# Patient Record
Sex: Male | Born: 1990 | Hispanic: Yes | Marital: Married | Smoking: Never smoker
Health system: Southern US, Community
[De-identification: ages and names within clinical notes are randomized; demographics above are authoritative.]

## PROBLEM LIST (undated history)

## (undated) DIAGNOSIS — R197 Diarrhea, unspecified: Secondary | ICD-10-CM

## (undated) DIAGNOSIS — E88819 Insulin resistance, unspecified: Secondary | ICD-10-CM

## (undated) HISTORY — DX: Diarrhea, unspecified: R19.7

---

## 2013-08-15 DIAGNOSIS — E041 Nontoxic single thyroid nodule: Secondary | ICD-10-CM | POA: Insufficient documentation

## 2014-06-14 DIAGNOSIS — R197 Diarrhea, unspecified: Secondary | ICD-10-CM

## 2014-06-14 HISTORY — DX: Diarrhea, unspecified: R19.7

## 2018-08-25 ENCOUNTER — Encounter: Payer: Self-pay | Admitting: Internal Medicine

## 2018-08-25 ENCOUNTER — Other Ambulatory Visit: Payer: Self-pay

## 2018-08-25 ENCOUNTER — Ambulatory Visit (INDEPENDENT_AMBULATORY_CARE_PROVIDER_SITE_OTHER): Payer: PRIVATE HEALTH INSURANCE | Admitting: Internal Medicine

## 2018-08-25 VITALS — BP 106/64 | HR 78 | Temp 98.3°F | Ht 63.6 in | Wt 175.0 lb

## 2018-08-25 DIAGNOSIS — R21 Rash and other nonspecific skin eruption: Secondary | ICD-10-CM | POA: Diagnosis not present

## 2018-08-25 DIAGNOSIS — L819 Disorder of pigmentation, unspecified: Secondary | ICD-10-CM | POA: Diagnosis not present

## 2018-08-25 NOTE — Patient Instructions (Signed)
Compre  [employer] y pongase a la piel por 10 minutos cada dia for 4-8 semanas.   Continua sin usar nada con Advice worker. y chocolate.  Pitiriasis versicolor Tinea Versicolor  La pitiriasis versicolor es una infeccin en la piel. Es causada por un tipo de levadura. La presencia de algunas levaduras es normal en la piel, pero el exceso de levaduras causa esta infeccin. La infeccin produce una erupcin de manchas claras u oscuras en la piel. La erupcin es ms frecuente que aparezca en la piel del pecho, la espalda, el cuello o la parte superior de [place]. La infeccin generalmente no causa otros problemas. Si se trata, la infeccin probablemente desaparezca en algunas semanas. La infeccin no se puede transmitir de [place] persona a otra (no es contagiosa). Siga estas indicaciones en su casa:  Use los medicamentos de venta libre y los recetados solamente como se lo haya indicado el mdico.  Frtese la piel todos los das con champ anticaspa como se lo haya indicado el mdico.  No se rasque la piel en la zona de la erupcin.  Evite los lugares clidos y hmedos.  No use cabinas de bronceado.  Trate de evitar transpirar demasiado. Comunquese con un mdico si:  Los sntomas empeoran.  Tiene fiebre.  Presenta enrojecimiento, hinchazn o dolor en la zona de la erupcin cutnea.  Observa lquido o sangre que sale de la erupcin cutnea.  La erupcin cutnea se siente caliente al tacto.  Tiene pus o percibe mal olor que [address] de la erupcin cutnea.  La erupcin cutnea regresa (es recurrente) despus del tratamiento. Resumen  La pitiriasis versicolor es una infeccin en la piel. Causa una erupcin de manchas claras u oscuras en la piel.  La erupcin es ms frecuente que aparezca en la piel del pecho, la espalda, el cuello o la parte superior de [place]. Esta infeccin generalmente no causa otros problemas.  Use los medicamentos de venta libre y los recetados solamente  como se lo haya indicado el mdico.  Si la infeccin se trata, probablemente desaparezca en algunas semanas. Esta informacin no tiene Theme park manager el consejo del mdico. Asegrese de hacerle al mdico cualquier pregunta que tenga. Document Released: 07/03/2010 Document Revised: 05/18/2017 Document Reviewed: 03/12/2014 Elsevier Interactive Patient Education  2019 [employer].

## 2018-08-25 NOTE — Progress Notes (Signed)
Subjective:     Patient ID: Vincent Curtis , male    DOB: 03-01-1991 , 28 y.o.   MRN: [id number]   Chief Complaint  Patient presents with  . New Patient (Initial Visit)    HPI  Pt is here to establish care. He would like me to look at a rash he has been dealing for a while. Has been having  skin discoloration and scaling x 2 months.  Has been having a rash x 3 years and hydrocortisone has helped the itching, but did not make it resolve the dark pigments. He has also tried antihistamines, and avoided chocolate and dairy and was asked to add hydrocortisone to a moisturizing cream, and the itching resolved while on it for 2 weeks, but the pigmented colors did not.  Has not been exposed to the sun, to tell if it does not tan or not.   Past Medical History:  Diagnosis Date  . Frequent loose stools 2016   even after avoiding dairy     Family History  Problem Relation Age of Onset  . Hypertension Mother   . Hypertension Maternal Grandmother     No current outpatient medications on file.      Review of Systems  Constitutional: Negative for chills, diaphoresis, fatigue and fever.  HENT: Negative for postnasal drip and rhinorrhea.   Gastrointestinal: Negative for abdominal distention, abdominal pain, constipation, diarrhea, nausea and vomiting.  Endocrine: Negative for polydipsia, polyphagia and polyuria.  Musculoskeletal: Negative for joint swelling.  Allergic/Immunologic: Positive for food allergies. Negative for environmental allergies.  Hematological: Negative for adenopathy.     Today's Vitals   08/25/18 0957  BP: 106/64  Pulse: 78  Temp: 98.3 F (36.8 C)  TempSrc: Oral  SpO2: 98%  Weight: 175 lb (79.4 kg)  Height: 5' 3.6" (1.615 m)   Body mass index is 30.42 kg/m.   Objective:  Physical Exam Vitals signs and nursing note reviewed.  Constitutional:      General: He is not in acute distress.    Appearance: He is obese. He is not toxic-appearing.  HENT:   Right Ear: External ear normal.     Left Ear: External ear normal.     Nose: Nose normal.     Mouth/Throat:     Mouth: Mucous membranes are moist.  Eyes:     General: No scleral icterus.    Conjunctiva/sclera: Conjunctivae normal.  Neck:     Musculoskeletal: Neck supple. No neck rigidity.  Pulmonary:     Effort: Pulmonary effort is normal.  Lymphadenopathy:     Cervical: No cervical adenopathy.  Skin:    General: Skin is warm and dry.     Findings: Rash present.     Comments: Has dark pigmented skin on his neck area, and dry light brown scales on abdomen and lower chest. Much milder on her back.   Neurological:     Mental Status: He is alert and oriented to person, place, and time.     Gait: Gait normal.  Psychiatric:        Mood and Affect: Mood normal.        Behavior: Behavior normal.        Thought Content: Thought content normal.        Judgment: Judgment normal.     Assessment And Plan:     1. Rash- chronic, seems food related, but may also has Tinea versicolor.  - CMP14 + Anion Gap   Advised to go back to  the diet restrictions and topical treament he tried in the past where he no longer had itching, plus I want him to try Selsun Blue shampoo on thorax area and leave it on during showers for 10 minutes qd. Fu 1 month.  2. Pigmented skin lesions- r/o insulin resistance.  - CMP14 + Anion Gap - Hemoglobin A1c - Insulin, random(561)   Rishit Burkhalter RODRIGUEZ-SOUTHWORTH, PA-C

## 2018-08-26 LAB — CMP14 + ANION GAP
ALT: 34 IU/L (ref 0–44)
AST: 25 IU/L (ref 0–40)
Albumin/Globulin Ratio: 1.6 (ref 1.2–2.2)
Albumin: 4.4 g/dL (ref 4.1–5.2)
Alkaline Phosphatase: 114 IU/L (ref 39–117)
Anion Gap: 13 mmol/L (ref 10.0–18.0)
BUN/Creatinine Ratio: 14 (ref 9–20)
BUN: 12 mg/dL (ref 6–20)
Bilirubin Total: 0.3 mg/dL (ref 0.0–1.2)
CO2: 25 mmol/L (ref 20–29)
CREATININE: 0.87 mg/dL (ref 0.76–1.27)
Calcium: 9.6 mg/dL (ref 8.7–10.2)
Chloride: 102 mmol/L (ref 96–106)
GFR calc Af Amer: 138 mL/min/{1.73_m2} (ref >59)
GFR, EST NON AFRICAN AMERICAN: 119 mL/min/{1.73_m2} (ref >59)
Globulin, Total: 2.8 g/dL (ref 1.5–4.5)
Glucose: 100 mg/dL — ABNORMAL HIGH (ref 65–99)
Potassium: 4.4 mmol/L (ref 3.5–5.2)
Sodium: 140 mmol/L (ref 134–144)
Total Protein: 7.2 g/dL (ref 6.0–8.5)

## 2018-08-26 LAB — HEMOGLOBIN A1C
Est. average glucose Bld gHb Est-mCnc: 97 mg/dL
Hgb A1c MFr Bld: 5 % (ref 4.8–5.6)

## 2018-08-26 LAB — INSULIN, RANDOM: INSULIN: 41.3 u[IU]/mL — ABNORMAL HIGH (ref 2.6–24.9)

## 2018-08-30 DIAGNOSIS — E8881 Metabolic syndrome: Secondary | ICD-10-CM | POA: Insufficient documentation

## 2018-09-26 ENCOUNTER — Ambulatory Visit: Payer: PRIVATE HEALTH INSURANCE | Admitting: Internal Medicine

## 2018-10-08 ENCOUNTER — Telehealth: Payer: Self-pay | Admitting: Internal Medicine

## 2018-10-08 NOTE — Telephone Encounter (Signed)
I spoke with pt to check on him regarding his diet and rash. His rash is gone, and he has cut down on carbs and has lot 1 kg, and is still working on wt loss and change in diet. Otherwise he is fine. He updated him on his wife which I will update on her chart. I told him since I am not in the clinic this month, to save my phone number in case they need me. Unsure if they will have me return to the clinic next month, but they can always communicate with me via my cell.

## 2018-11-16 ENCOUNTER — Ambulatory Visit (INDEPENDENT_AMBULATORY_CARE_PROVIDER_SITE_OTHER): Payer: PRIVATE HEALTH INSURANCE | Admitting: Internal Medicine

## 2018-11-16 ENCOUNTER — Encounter: Payer: Self-pay | Admitting: Internal Medicine

## 2018-11-16 ENCOUNTER — Other Ambulatory Visit: Payer: Self-pay

## 2018-11-16 VITALS — BP 118/62 | HR 72 | Temp 98.8°F | Ht 62.8 in | Wt 168.0 lb

## 2018-11-16 DIAGNOSIS — R7309 Other abnormal glucose: Secondary | ICD-10-CM

## 2018-11-16 DIAGNOSIS — L210 Seborrhea capitis: Secondary | ICD-10-CM

## 2018-11-16 NOTE — Progress Notes (Signed)
  Subjective:     Patient ID: Vincent Curtis , male    DOB: 1990-06-25 , 28 y.o.   MRN: [id number]   Chief Complaint  Patient presents with  . Abnormal Lab    HPI  Pt is here for FU rash and insulin resistance. He states he has lost more wt that what he weighs right now, but has gained some of it back. He has noticed if he eats sweets gets diarrhea specially ice cream. Has changed his milk to lactose free and low sugar.   Past Medical History:  Diagnosis Date  . Frequent loose stools 2016   even after avoiding dairy     Family History  Problem Relation Age of Onset  . Hypertension Mother   . Hypertension Maternal Grandmother     No current outpatient medications on file.      Review of Systems  Dealing with drandruf and has not used anything for this. He washes his hair every 3 days. Otherwise the rest is neg.  Today's Vitals   11/16/18 1508  BP: 118/62  Pulse: 72  Temp: 98.8 F (37.1 C)  TempSrc: Oral  SpO2: 98%  Weight: 168 lb (76.2 kg)  Height: 5' 2.8" (1.595 m)   Body mass index is 29.95 kg/m.   Objective:  Physical Exam  7 lbs down.  Constitutional: he is oriented to person, place, and time. he appears well-developed and well-nourished. No distress.  HENT:  Head: Normocephalic and atraumatic.  Right Ear: External ear normal.  Left Ear: External ear normal.  Nose: Nose normal.  Eyes: Conjunctivae are normal. Right eye exhibits no discharge. Left eye exhibits no discharge. No scleral icterus.  Neck: Neck supple. No thyromegaly present.  Cardiovascular: Normal rate and regular rhythm.  No murmur heard. Pulmonary/Chest: Effort normal and breath sounds normal. No respiratory distress.  Musculoskeletal: Normal range of motion. She exhibits no edema.  Lymphadenopathy: he has no cervical adenopathy.  Neurological: he is alert and oriented to person, place, and time.  Skin: Skin is warm and dry. No rash noted, he is not diaphoretic.  Psychiatric: he has a  normal mood and affect. Hid behavior is normal. Judgment and thought content normal.  Nursing note reviewed.    Assessment And Plan:    1. Abnormal glucose  And insulin resistance. He will continue watching his sugar intake and loosing wt.  - Hemoglobin A1c - Insulin, random(561)  2. Dandruff in adult- chronic     I advised him to get T gel or Ketoconazole shampoo as use it as directed.   FU in 3 months for a physical.   Vincent Wotton RODRIGUEZ-SOUTHWORTH, PA-C    THE PATIENT IS ENCOURAGED TO PRACTICE SOCIAL DISTANCING DUE TO THE COVID-19 PANDEMIC.

## 2018-11-17 LAB — INSULIN, RANDOM: INSULIN: 117 u[IU]/mL — ABNORMAL HIGH (ref 2.6–24.9)

## 2018-11-17 LAB — HEMOGLOBIN A1C
Est. average glucose Bld gHb Est-mCnc: 103 mg/dL
Hgb A1c MFr Bld: 5.2 % (ref 4.8–5.6)

## 2018-11-23 ENCOUNTER — Encounter: Payer: Self-pay | Admitting: Internal Medicine

## 2018-11-23 DIAGNOSIS — E8881 Metabolic syndrome: Secondary | ICD-10-CM

## 2018-11-30 MED ORDER — METFORMIN HCL 500 MG PO TABS: ORAL_TABLET | ORAL | 0 refills | Status: DC

## 2019-02-15 ENCOUNTER — Other Ambulatory Visit: Payer: Self-pay

## 2019-02-15 ENCOUNTER — Encounter: Payer: Self-pay | Admitting: Internal Medicine

## 2019-02-15 ENCOUNTER — Ambulatory Visit (INDEPENDENT_AMBULATORY_CARE_PROVIDER_SITE_OTHER): Payer: PRIVATE HEALTH INSURANCE | Admitting: Internal Medicine

## 2019-02-15 VITALS — BP 118/78 | HR 72 | Temp 98.4°F | Ht 62.8 in | Wt 162.6 lb

## 2019-02-15 DIAGNOSIS — Z0000 Encounter for general adult medical examination without abnormal findings: Secondary | ICD-10-CM | POA: Diagnosis not present

## 2019-02-15 DIAGNOSIS — E8881 Metabolic syndrome: Secondary | ICD-10-CM

## 2019-02-15 DIAGNOSIS — Z23 Encounter for immunization: Secondary | ICD-10-CM | POA: Diagnosis not present

## 2019-02-15 LAB — POCT URINALYSIS DIPSTICK
Bilirubin, UA: NEGATIVE
Glucose, UA: NEGATIVE
Ketones, UA: NEGATIVE
Leukocytes, UA: NEGATIVE
Nitrite, UA: NEGATIVE
Protein, UA: NEGATIVE
Spec Grav, UA: >=1.03 — AB (ref 1.010–1.025)
Urobilinogen, UA: 0.2 E.U./dL
pH, UA: 5.5 (ref 5.0–8.0)

## 2019-02-15 LAB — POCT UA - MICROALBUMIN
Albumin/Creatinine Ratio, Urine, POC: <30
Creatinine, POC: 200 mg/dL
Microalbumin Ur, POC: 10 mg/L

## 2019-02-15 MED ORDER — METFORMIN HCL 500 MG PO TABS: ORAL_TABLET | ORAL | 0 refills | Status: DC

## 2019-02-15 NOTE — Progress Notes (Signed)
Subjective:     Patient ID: Vincent Curtis , male    DOB: November 15, 1990 , 28 y.o.   MRN: [id number]   Chief Complaint  Patient presents with  . Annual Exam    HPI  Pt is here for a physical and FU on insulin resistance. Does not have any complaints   Past Medical History:  Diagnosis Date  . Frequent loose stools 2016   even after avoiding dairy     Family History  Problem Relation Age of Onset  . Hypertension Mother   . Hypertension Maternal Grandmother      Current Outpatient Medications:  .  metFORMIN (GLUCOPHAGE) 500 MG tablet, One with dinner for insulin resistance, Disp: 90 tablet, Rfl: 0      Review of Systems  His loose stools have resolved, + for wt gain, the rest is neg.  Today's Vitals   02/15/19 1423  BP: 118/78  Pulse: 72  Temp: 98.4 F (36.9 C)  TempSrc: Oral  Weight: 162 lb 9.6 oz (73.8 kg)  Height: 5' 2.8" (1.595 m)   Body mass index is 28.99 kg/m.   Objective:  Physical Exam  Wt is down 6 lbs since on metformin.   BP 118/78 (BP Location: Left Arm, Patient Position: Sitting, Cuff Size: Normal)   Pulse 72   Temp 98.4 F (36.9 C) (Oral)   Ht 5' 2.8" (1.595 m)   Wt 162 lb 9.6 oz (73.8 kg)   BMI 28.99 kg/m   General Appearance:    Alert, cooperative, no distress, appears stated age  Head:    Normocephalic, without obvious abnormality, atraumatic  Eyes:    PERRL, conjunctiva/corneas clear, EOM's intact, fundi    benign, both eyes       Ears:    Normal TM's and external ear canals, both ears  Nose:   Nares normal, septum midline, mucosa normal, no drainage   or sinus tenderness  Throat:   Lips, mucosa, and tongue normal; teeth and gums normal  Neck:   Supple, symmetrical, trachea midline, no adenopathy;       thyroid:  No enlargement/tenderness/nodules.  Back:     Symmetric, no curvature, ROM normal, no CVA tenderness  Lungs:     Clear to auscultation bilaterally, respirations unlabored  Chest wall:    No tenderness or deformity  Heart:     Regular rate and rhythm, S1 and S2 normal, no murmur, rub   or gallop  Abdomen:     Soft, non-tender, bowel sounds active all four quadrants,    no masses, no organomegaly  Genitalia:    Normal male without lesion, discharge or tenderness     Extremities:   Extremities normal, atraumatic, no cyanosis or edema  Pulses:   2+ and symmetric all extremities  Skin:   Skin color, texture, turgor normal, no rashes or lesions  Lymph nodes:   Cervical, supraclavicular, and axillary nodes normal  Neurologic:   CNII-XII intact. Normal strength, sensation and reflexes      throughout    Assessment And Plan:     1. Need for influenza vaccination - Flu Vaccine QUAD 6+ mos PF IM (Fluarix Quad PF)  2. Routine medical exam- routine. Encouraged to get back on low carb diet.    I will inform him when his labs are back.  - CMP14 + Anion Gap - Lipid Profile - CBC no Diff - POCT Urinalysis Dipstick (81002)- neg 3- Insulin resistance- chronic. Insulin levels ordered. May continue Metformin 500 mg  qd.       Crissie Aloi RODRIGUEZ-SOUTHWORTH, PA-C    THE PATIENT IS ENCOURAGED TO PRACTICE SOCIAL DISTANCING DUE TO THE COVID-19 PANDEMIC.

## 2019-02-15 NOTE — Patient Instructions (Signed)
Cuidados preventivos en los hombres de 21 a 39 aos de edad Preventive Care 39-27 Years Old, Male Los cuidados preventivos hacen referencia a las opciones en cuanto al estilo de vida y a las visitas al mdico, las cuales pueden promover la salud y [name]. Esto puede comprender lo siguiente:  Un examen fsico anual. Esto tambin se conoce como control de bienestar anual.  Exmenes dentales y oculares de [place] regular.  Vacunas.  Estudios para Hospital doctor.  Opciones saludables de estilo de vida, como seguir una dieta saludable, hacer ejercicio regularmente, no usar drogas ni productos que contengan nicotina y tabaco, y limitar el consumo de alcohol. Qu puedo esperar para mi visita de cuidado preventivo? Examen fsico El mdico controlar lo siguiente:  Diplomatic Services operational officer y [place]. Estos datos se pueden usar para calcular el ndice de masa corporal (IMC), una medicin que indica si usted tiene un peso saludable.  Frecuencia cardaca y presin arterial.  Piel para detectar manchas anormales. Asesoramiento El mdico puede hacerle preguntas sobre lo siguiente:  Consumo de tabaco, alcohol y drogas.  Bienestar emocional.  Bienestar en el hogar y sus relaciones personales.  Actividad sexual.  Hbitos de alimentacin.  Trabajo y [place] laboral. [name] vacunas necesito?  [place] antigripal  Se recomienda aplicarse esta vacuna todos los [place]. Vacuna contra el ttanos, la difteria y la tos ferina (Tdap)  Es posible que tenga que aplicarse un refuerzo contra el ttanos y la difteria (DT) cada 10aos. Vacuna contra la varicela  Es posible que tenga que aplicrsela si an no la recibi. Vacuna contra el virus del papiloma humano (VPH)  Si el mdico se lo recomienda, Engineer, materials tres dosis a lo largo de 6 meses. Vacuna contra el sarampin, la rubola y las paperas (SRP)  Tal vez tenga que aplicarse por lo menos una dosis de la [place]. Tambin es posible que necesite  una segunda dosis. Vacuna antimeningoccica conjugada (MenACWY)  Se recomienda la aplicacin de una dosis si tiene [employer] 19 y los 21aos de [place], y es estudiante universitario de Engineer, maintenance ao que vive en una residencia estudiantil, o si tiene una de varias afecciones mdicas. Podra tambin necesitar dosis de refuerzo. Vacuna antineumoccica conjugada (PCV13)  Puede necesitar esta vacuna si tiene determinadas enfermedades y no se vacun anteriormente. [name] antineumoccica de polisacridos (PPSV23)  Quizs tenga que aplicarse una o dos dosis si fuma o si tiene determinadas afecciones. Vacuna contra la hepatitis A  Es posible que necesite esta vacuna si tiene ciertas afecciones o si viaja o trabaja en lugares en los que podra estar expuesto a la hepatitis A. Vacuna contra la hepatitis B  Es posible que necesite esta vacuna si tiene ciertas afecciones o si viaja o trabaja en lugares en los que podra estar expuesto a la hepatitis B. Vacuna antihaemophilus influenzae tipo B (Hib)  Es posible que necesite esta vacuna si tiene algunos factores de [place]. Puede recibir las vacunas en forma de dosis individuales o en forma de dos o ms vacunas juntas en la misma inyeccin (vacunas combinadas). Hable con su mdico [employer] y beneficios de las vacunas [place]. Qu pruebas necesito? Anlisis de [employer] de lpidos y colesterol. Estos se pueden verificar cada 5 aos, a partir de los [address] de [place].  Anlisis de hepatitisC.  Anlisis de hepatitisB. Pruebas de deteccin   Pruebas de deteccin de la diabetes. Esto se realiza mediante un control del azcar en la sangre (glucosa) despus de no haber comido durante  un periodo de tiempo (ayuno).  Anlisis de enfermedades de transmisin sexual (ETS). Hable con su mdico [employer], las opciones de tratamiento y, si corresponde, la necesidad de Optometrist ms pruebas. Siga estas instrucciones en su  casa: Comida y bebida   Siga una dieta que incluya frutas y verduras frescas, cereales integrales, protenas magras y productos lcteos descremados.  Tome los suplementos vitamnicos y [employer] se lo haya indicado el mdico.  No beba alcohol si el mdico se lo prohbe.  Si bebe alcohol: ? Limite la cantidad que consume de 0 a 2 medidas por da. ? Est atento a la cantidad de alcohol que hay en las bebidas que toma. En los [place], una medida equivale a una botella de cerveza de 12oz (335ml), un vaso de vino de 5oz (179ml) o un vaso de una bebida alcohlica de alta graduacin de 1oz (27ml). Estilo de [employer] y las encas a diario.  Mantngase activo. Haga al menos 66minutos de ejercicio 5o ms [employer].  No consuma ningn producto que contenga nicotina o tabaco, como cigarrillos, cigarrillos electrnicos y tabaco de Higher education careers adviser. Si necesita ayuda para dejar de fumar, consulte al mdico.  Si es sexualmente activo, practique sexo seguro. Use un condn u otra forma de proteccin para prevenir las ITS (infecciones de transmisin sexual). Cundo volver?  Acuda al mdico una vez al ao para una visita de control.  Pregntele al mdico con qu frecuencia debe realizarse un control de la vista y los dientes.  Mantenga su esquema de vacunacin al da. Esta informacin no tiene Marine scientist el consejo del mdico. Asegrese de hacerle al mdico cualquier pregunta que tenga. Document Released: 03/10/2005 Document Revised: 06/23/2018 Document Reviewed: 06/23/2018 Elsevier Patient Education  2020 [employer].

## 2019-02-20 ENCOUNTER — Other Ambulatory Visit: Payer: PRIVATE HEALTH INSURANCE

## 2019-03-08 DIAGNOSIS — E782 Mixed hyperlipidemia: Secondary | ICD-10-CM | POA: Insufficient documentation

## 2019-03-13 ENCOUNTER — Other Ambulatory Visit: Payer: Self-pay

## 2019-03-13 ENCOUNTER — Other Ambulatory Visit: Payer: PRIVATE HEALTH INSURANCE

## 2019-03-13 DIAGNOSIS — Z0000 Encounter for general adult medical examination without abnormal findings: Secondary | ICD-10-CM

## 2019-03-14 LAB — LIPID PANEL
Chol/HDL Ratio: 4.7 ratio (ref 0.0–5.0)
Cholesterol, Total: 207 mg/dL — ABNORMAL HIGH (ref 100–199)
HDL: 44 mg/dL (ref >39)
LDL Chol Calc (NIH): 134 mg/dL — ABNORMAL HIGH (ref 0–99)
Triglycerides: 159 mg/dL — ABNORMAL HIGH (ref 0–149)
VLDL Cholesterol Cal: 29 mg/dL (ref 5–40)

## 2019-03-14 LAB — CMP14 + ANION GAP
ALT: 19 IU/L (ref 0–44)
AST: 21 IU/L (ref 0–40)
Albumin/Globulin Ratio: 1.6 (ref 1.2–2.2)
Albumin: 4.8 g/dL (ref 4.1–5.2)
Alkaline Phosphatase: 107 IU/L (ref 39–117)
Anion Gap: 14 mmol/L (ref 10.0–18.0)
BUN/Creatinine Ratio: 18 (ref 9–20)
BUN: 17 mg/dL (ref 6–20)
Bilirubin Total: 0.6 mg/dL (ref 0.0–1.2)
CO2: 25 mmol/L (ref 20–29)
Calcium: 9.6 mg/dL (ref 8.7–10.2)
Chloride: 100 mmol/L (ref 96–106)
Creatinine, Ser: 0.95 mg/dL (ref 0.76–1.27)
GFR calc Af Amer: 126 mL/min/{1.73_m2} (ref >59)
GFR calc non Af Amer: 109 mL/min/{1.73_m2} (ref >59)
Globulin, Total: 3 g/dL (ref 1.5–4.5)
Glucose: 86 mg/dL (ref 65–99)
Potassium: 4.9 mmol/L (ref 3.5–5.2)
Sodium: 139 mmol/L (ref 134–144)
Total Protein: 7.8 g/dL (ref 6.0–8.5)

## 2019-03-14 LAB — CBC
Hematocrit: 48.4 % (ref 37.5–51.0)
Hemoglobin: 16.3 g/dL (ref 13.0–17.7)
MCH: 29.1 pg (ref 26.6–33.0)
MCHC: 33.7 g/dL (ref 31.5–35.7)
MCV: 86 fL (ref 79–97)
Platelets: 280 10*3/uL (ref 150–450)
RBC: 5.61 x10E6/uL (ref 4.14–5.80)
RDW: 12.8 % (ref 11.6–15.4)
WBC: 8.2 10*3/uL (ref 3.4–10.8)

## 2019-03-15 ENCOUNTER — Encounter: Payer: Self-pay | Admitting: Internal Medicine

## 2019-03-20 ENCOUNTER — Encounter: Payer: Self-pay | Admitting: Internal Medicine

## 2019-05-23 ENCOUNTER — Other Ambulatory Visit: Payer: Self-pay

## 2019-05-23 ENCOUNTER — Ambulatory Visit
Admission: EM | Admit: 2019-05-23 | Discharge: 2019-05-23 | Disposition: A | Discharge status: Home / Self Care | Payer: PRIVATE HEALTH INSURANCE | Attending: Urgent Care | Admitting: Urgent Care

## 2019-05-23 DIAGNOSIS — Z20828 Contact with and (suspected) exposure to other viral communicable diseases: Secondary | ICD-10-CM | POA: Diagnosis not present

## 2019-05-23 DIAGNOSIS — Z20822 Contact with and (suspected) exposure to covid-19: Secondary | ICD-10-CM

## 2019-05-23 NOTE — ED Triage Notes (Signed)
Pt presents to UC w/ c/o covid positive exposure 2 days ago. Pt denies any symptoms at this time.

## 2019-05-23 NOTE — ED Provider Notes (Signed)
[facility]    MRN: [id number] DOB: 07/04/1990  Subjective:   Vincent Curtis is a 28 y.o. male presenting for COVID-19 testing.  He had exposure about 3 days ago.  Denies having any symptoms  No current facility-administered medications for this encounter.   Current Outpatient Medications:  .  metFORMIN (GLUCOPHAGE) 500 MG tablet, One with dinner for insulin resistance, Disp: 90 tablet, Rfl: 0   No Known Allergies  Past Medical History:  Diagnosis Date  . Frequent loose stools 2016   even after avoiding dairy     History reviewed. No pertinent surgical history.  Family History  Problem Relation Age of Onset  . Hypertension Mother   . Hypertension Maternal Grandmother     Social History   Tobacco Use  . Smoking status: Never Smoker  . Smokeless tobacco: Never Used  Substance Use Topics  . Alcohol use: Not Currently  . Drug use: Never    Review of Systems  Constitutional: Negative for fever and malaise/fatigue.  HENT: Negative for congestion, ear pain, sinus pain and sore throat.   Eyes: Negative for discharge and redness.  Respiratory: Negative for cough, hemoptysis, shortness of breath and wheezing.   Cardiovascular: Negative for chest pain.  Gastrointestinal: Negative for abdominal pain, diarrhea, nausea and vomiting.  Genitourinary: Negative for dysuria, flank pain and hematuria.  Musculoskeletal: Negative for myalgias.  Skin: Negative for rash.  Neurological: Negative for dizziness, weakness and headaches.  Psychiatric/Behavioral: Negative for depression and substance abuse.     Objective:   Vitals: BP 116/76 (BP Location: Right Arm)   Pulse 79   Temp 98.8 F (37.1 C) (Oral)   Resp 16   SpO2 97%   Physical Exam Constitutional:      General: He is not in acute distress.    Appearance: Normal appearance. He is well-developed and normal weight. He is not ill-appearing, toxic-appearing or diaphoretic.  HENT:     Head:  Normocephalic and atraumatic.     Right Ear: External ear normal.     Left Ear: External ear normal.     Nose: Nose normal.     Mouth/Throat:     Mouth: Mucous membranes are moist.     Pharynx: Oropharynx is clear.  Eyes:     General: No scleral icterus.       Right eye: No discharge.        Left eye: No discharge.     Extraocular Movements: Extraocular movements intact.     Pupils: Pupils are equal, round, and reactive to light.  Neck:     Musculoskeletal: Normal range of motion.  Cardiovascular:     Rate and Rhythm: Normal rate and regular rhythm.     Heart sounds: Normal heart sounds. No murmur. No friction rub. No gallop.   Pulmonary:     Effort: Pulmonary effort is normal. No respiratory distress.     Breath sounds: Normal breath sounds. No stridor. No wheezing, rhonchi or rales.  Neurological:     Mental Status: He is alert and oriented to person, place, and time.  Psychiatric:        Mood and Affect: Mood normal.        Behavior: Behavior normal.        Thought Content: Thought content normal.        Judgment: Judgment normal.      Assessment and Plan :   1. Close exposure to COVID-19 virus    Counseled patient on nature of COVID-19  including modes of transmission, diagnostic testing, management and supportive care.  Counseled on medications used for symptomatic relief. COVID 19 testing is pending. Counseled patient on potential for adverse effects with medications prescribed/recommended today, ER and return-to-clinic precautions discussed, patient verbalized understanding.      Wallis Bamberg, PA-C 05/23/19 1624

## 2019-05-23 NOTE — Discharge Instructions (Signed)
Para el dolor de garganta o tos intente usar un t a base de miel. Use 3 cucharaditas de miel con jugo exprimido de [employer]. Coloque trozos de Pension scheme manager en 1 / 2-1 taza de agua y caliente sobre la estufa. Luego mezcle los ingredientes y repita cada 4 horas segn sea necesario. Tome Tylenol 500 mg cada 6 horas. Hidrata muy bien con al menos 2 litros de [place]. Coma comidas ligeras como sopas para [employer] electrolitos y coma frutas suaves, verduras. Comience un antihistamnico como Zyrtec, Allegra o Claritin. Puede recoger pseudoefedrina de venta libre (Sudafed) y usarla para el drenaje posnasal, la congestin nasal a una dosis de 60 mg cada 8 horas o 120 mg cada 12 horas, segn sea necesario. Use el jarabe por la noche para su tos y las capsulas durante el dia.

## 2019-05-25 LAB — NOVEL CORONAVIRUS, NAA: SARS-CoV-2, NAA: NOT DETECTED

## 2019-07-02 ENCOUNTER — Other Ambulatory Visit: Payer: Self-pay | Admitting: Internal Medicine

## 2019-07-02 MED ORDER — METFORMIN HCL 500 MG PO TABS: ORAL_TABLET | ORAL | 0 refills | Status: DC

## 2019-08-16 ENCOUNTER — Other Ambulatory Visit: Payer: Self-pay

## 2019-08-16 ENCOUNTER — Ambulatory Visit (INDEPENDENT_AMBULATORY_CARE_PROVIDER_SITE_OTHER): Payer: PRIVATE HEALTH INSURANCE | Admitting: Internal Medicine

## 2019-08-16 ENCOUNTER — Encounter: Payer: Self-pay | Admitting: Internal Medicine

## 2019-08-16 VITALS — BP 118/82 | HR 62 | Temp 98.5°F | Ht 62.0 in | Wt 175.8 lb

## 2019-08-16 DIAGNOSIS — E669 Obesity, unspecified: Secondary | ICD-10-CM

## 2019-08-16 DIAGNOSIS — R7309 Other abnormal glucose: Secondary | ICD-10-CM

## 2019-08-16 DIAGNOSIS — E8881 Metabolic syndrome: Secondary | ICD-10-CM

## 2019-08-16 DIAGNOSIS — R635 Abnormal weight gain: Secondary | ICD-10-CM | POA: Diagnosis not present

## 2019-08-16 DIAGNOSIS — E7849 Other hyperlipidemia: Secondary | ICD-10-CM

## 2019-08-16 DIAGNOSIS — Z6832 Body mass index (BMI) 32.0-32.9, adult: Secondary | ICD-10-CM

## 2019-08-16 DIAGNOSIS — E041 Nontoxic single thyroid nodule: Secondary | ICD-10-CM

## 2019-08-16 NOTE — Progress Notes (Addendum)
This visit occurred during the SARS-CoV-2 public health emergency.  Safety protocols were in place, including screening questions prior to the visit, additional usage of staff PPE, and extensive cleaning of exam room while observing appropriate contact time as indicated for disinfecting solutions.  Subjective:     Patient ID: Vincent Curtis , male    DOB: February 24, 1991 , 29 y.o.   MRN: [id number]   Chief Complaint  Patient presents with  . Diabetes    HPI Here for FU wt and Insuline resistance. Admits he has not been strict with his diet at all. Has been taking the Metformin qd and is tolerating this fine.     Past Medical History:  Diagnosis Date  . Frequent loose stools 2016   even after avoiding dairy     Family History  Problem Relation Age of Onset  . Hypertension Mother   . Hypertension Maternal Grandmother   Negative for thyroid cancer   Current Outpatient Medications:  .  metFORMIN (GLUCOPHAGE) 500 MG tablet, One with dinner for insulin resistance, Disp: 90 tablet, Rfl: 0   No Known Allergies   Review of Systems  Denies polydipsia, polyuria, polyphagia, heat or cold intolerance. + for weight gain Has hx of R thydoid lump 6 years ago and when he lived in [place] country had what sound like a scan and was told every thing was fine and was never told to have any follow up testing since, but on occasion feels pulsation on his R thyroid lobe area where he feels a small knot. This area is not tender. Paternal aunt has thyroid disease Today's Vitals   08/16/19 0840  BP: 118/82  Pulse: 62  Temp: 98.5 F (36.9 C)  Weight: 175 lb 12.8 oz (79.7 kg)  Height: 5\' 2"  (1.575 m)   Body mass index is 32.15 kg/m.   Objective:  Physical Exam  Wt is up 13 lbs   Constitutional: he is oriented to person, place, and time. he appears well-developed and well-nourished. No distress.  HENT:  Head: Normocephalic and atraumatic.  Right Ear: External ear normal.  Left Ear: External ear  normal.  Nose: Nose normal.  Eyes: Conjunctivae are normal. Right eye exhibits no discharge. Left eye exhibits no discharge. No scleral icterus.  Neck: Neck supple. Has  A peas size nodule on R thyroid lobe but seems more towads the anterior region of the sternocleidomastoid medially. No carotid bruits bilaterally  Cardiovascular: Normal rate and regular rhythm.  No murmur heard. Pulmonary/Chest: Effort normal and breath sounds normal. No respiratory distress.  Musculoskeletal: Normal range of motion. he exhibits no edema.  Lymphadenopathy:  he has no cervical adenopathy. Neurological: he is alert and oriented to person, place, and time.  Skin: Skin is warm and dry. Capillary refill takes less than 2 seconds. He is not diaphoretic.  Psychiatric: he has a normal mood and affect. His behavior is normal. Judgment and thought content normal.  Nursing note reviewed.  Assessment And Plan:     1. Abnormal glucose- chronic - CMP14 + Anion Gap  2. Insulin resistance- chronic. With his weight increasing every time I see him, I am sure his levels are still elevated. I educated him about Ozempic and how it works bs Rybelsus the oral form, but he said he does not always remember to take the [place] and once a week shot would be easier for him and is willing to try the Lerna. I reviewed the common side effects like nausea if he pushes  eating when he starts feeling full and constipation. I started him on Ozempic 0.25 mg /week x 4 weeks to see if this will help him better with wt loss and insulin resistance. One sample box give. FU in 4 weeks.    He was educated about this medication and given precautions of this med.  - Insulin, random([phone])  3. Other hyperlipidemia- chronic. No action. Will await on his results.  - Lipid panel  4. Weight gain- persistent and worsening.  - CMP14 + Anion Gap  5. Thyroid nodule- chronic.  - TSH - Thyroid antibodies - Thyroid Peroxidase Antibody - US THYROID;  Future 6. Obesity- worsening. I am hoping the Ozempic will help him with weight loss.  Fu in 1 month for wt check and how he is doing with the Ozempic.  Vincent Leaming RODRIGUEZ-SOUTHWORTH, PA-C    THE PATIENT IS ENCOURAGED TO PRACTICE SOCIAL DISTANCING DUE TO THE COVID-19 PANDEMIC.

## 2019-08-17 ENCOUNTER — Other Ambulatory Visit: Payer: Self-pay

## 2019-08-17 ENCOUNTER — Ambulatory Visit
Admission: EM | Admit: 2019-08-17 | Discharge: 2019-08-17 | Disposition: A | Discharge status: Home / Self Care | Payer: 59 | Attending: Emergency Medicine | Admitting: Emergency Medicine

## 2019-08-17 DIAGNOSIS — R6889 Other general symptoms and signs: Secondary | ICD-10-CM

## 2019-08-17 DIAGNOSIS — Z20822 Contact with and (suspected) exposure to covid-19: Secondary | ICD-10-CM | POA: Diagnosis not present

## 2019-08-17 LAB — CMP14 + ANION GAP
ALT: 24 IU/L (ref 0–44)
AST: 18 IU/L (ref 0–40)
Albumin/Globulin Ratio: 1.5 (ref 1.2–2.2)
Albumin: 4.4 g/dL (ref 4.1–5.2)
Alkaline Phosphatase: 103 IU/L (ref 39–117)
Anion Gap: 15 mmol/L (ref 10.0–18.0)
BUN/Creatinine Ratio: 13 (ref 9–20)
BUN: 12 mg/dL (ref 6–20)
Bilirubin Total: 0.7 mg/dL (ref 0.0–1.2)
CO2: 24 mmol/L (ref 20–29)
Calcium: 9.4 mg/dL (ref 8.7–10.2)
Chloride: 100 mmol/L (ref 96–106)
Creatinine, Ser: 0.92 mg/dL (ref 0.76–1.27)
GFR calc Af Amer: 131 mL/min/{1.73_m2} (ref >59)
GFR calc non Af Amer: 114 mL/min/{1.73_m2} (ref >59)
Globulin, Total: 3 g/dL (ref 1.5–4.5)
Glucose: 84 mg/dL (ref 65–99)
Potassium: 4.7 mmol/L (ref 3.5–5.2)
Sodium: 139 mmol/L (ref 134–144)
Total Protein: 7.4 g/dL (ref 6.0–8.5)

## 2019-08-17 LAB — LIPID PANEL
Chol/HDL Ratio: 4.5 ratio (ref 0.0–5.0)
Cholesterol, Total: 211 mg/dL — ABNORMAL HIGH (ref 100–199)
HDL: 47 mg/dL (ref >39)
LDL Chol Calc (NIH): 135 mg/dL — ABNORMAL HIGH (ref 0–99)
Triglycerides: 164 mg/dL — ABNORMAL HIGH (ref 0–149)
VLDL Cholesterol Cal: 29 mg/dL (ref 5–40)

## 2019-08-17 LAB — INSULIN, RANDOM: INSULIN: 20.5 u[IU]/mL (ref 2.6–24.9)

## 2019-08-17 LAB — POCT FASTING CBG KUC MANUAL ENTRY: POCT Glucose (KUC): 96 mg/dL (ref 70–99)

## 2019-08-17 LAB — THYROID ANTIBODIES
Thyroglobulin Antibody: 4.3 IU/mL — ABNORMAL HIGH (ref 0.0–0.9)
Thyroperoxidase Ab SerPl-aCnc: 18 IU/mL (ref 0–34)

## 2019-08-17 LAB — POCT INFLUENZA A/B
Influenza A, POC: NEGATIVE
Influenza B, POC: NEGATIVE

## 2019-08-17 LAB — TSH: TSH: 2.63 u[IU]/mL (ref 0.450–4.500)

## 2019-08-17 NOTE — ED Triage Notes (Addendum)
Pt presents to UC w/ c/o tremors, shaking, body aches, and chills since last night into today after a new medication injection given by pcp yesterday. The medication is Ozempic 2mg . Pt has DM and takes metformin regularly. Pt has taken Tylenol which helps w/ symptoms temporarily.  Tested covid negative today.

## 2019-08-17 NOTE — ED Provider Notes (Signed)
[facility]   [id number] 08/17/19 Arrival Time: 1727   CC: COVID symptoms  SUBJECTIVE: History from: patient.  Vincent Curtis is a 29 y.o. male who presents with tremors, shaking, body aches and chills x 1 day.  Denies sick exposure to COVID, flu or strep.  Denies recent travel.  Has tried tylenol with relief.  Symptoms are made worse with the cold.  Denies previous symptoms in the past.   Denies sinus pain, rhinorrhea, congestion, sore throat, cough, SOB, wheezing, chest pain, nausea, vomiting, changes in bowel or bladder habits.    ROS: As per HPI.  All other pertinent ROS negative.     Past Medical History:  Diagnosis Date  . Frequent loose stools 2016   even after avoiding dairy   History reviewed. No pertinent surgical history. No Known Allergies No current facility-administered medications on file prior to encounter.   Current Outpatient Medications on File Prior to Encounter  Medication Sig Dispense Refill  . metFORMIN (GLUCOPHAGE) 500 MG tablet One with dinner for insulin resistance 90 tablet 0   Social History   Socioeconomic History  . Marital status: Married    Spouse name: Not on file  . Number of children: Not on file  . Years of education: Not on file  . Highest education level: Not on file  Occupational History  . Not on file  Tobacco Use  . Smoking status: Never Smoker  . Smokeless tobacco: Never Used  Substance and Sexual Activity  . Alcohol use: Not Currently  . Drug use: Never  . Sexual activity: Yes  Other Topics Concern  . Not on file  Social History Narrative  . Not on file   Social Determinants of Health   Financial Resource Strain:   . Difficulty of Paying Living Expenses: Not on file  Food Insecurity:   . Worried About Programme researcher, broadcasting/film/video in the Last Year: Not on file  . Ran Out of Food in the Last Year: Not on file  Transportation Needs:   . Lack of Transportation (Medical): Not on file  . Lack of Transportation  (Non-Medical): Not on file  Physical Activity:   . Days of Exercise per Week: Not on file  . Minutes of Exercise per Session: Not on file  Stress:   . Feeling of Stress : Not on file  Social Connections:   . Frequency of Communication with Friends and Family: Not on file  . Frequency of Social Gatherings with Friends and Family: Not on file  . Attends Religious Services: Not on file  . Active Member of Clubs or Organizations: Not on file  . Attends Banker Meetings: Not on file  . Marital Status: Not on file  Intimate Partner Violence:   . Fear of Current or Ex-Partner: Not on file  . Emotionally Abused: Not on file  . Physically Abused: Not on file  . Sexually Abused: Not on file   Family History  Problem Relation Age of Onset  . Hypertension Mother   . Hypertension Maternal Grandmother     OBJECTIVE:  Vitals:   08/17/19 1841  BP: 121/84  Pulse: (!) 106  Resp: 16  Temp: (!) 100.8 F (38.2 C)  TempSrc: Oral  SpO2: 97%     General appearance: alert; appears mildly fatigued, but nontoxic; speaking in full sentences and tolerating own secretions HEENT: NCAT; Ears: EACs clear, TMs pearly gray; Eyes: PERRL.  EOM grossly intact. Nose: nares patent without rhinorrhea, Throat: oropharynx  clear, tonsils non erythematous or enlarged, uvula midline  Neck: supple without LAD Lungs: unlabored respirations, symmetrical air entry; cough: absent; no respiratory distress; CTAB Heart: regular rate and rhythm.   Skin: warm and dry Psychological: alert and cooperative; normal mood and affect  LABS:  Results for orders placed or performed during the hospital encounter of 08/17/19 (from the past 24 hour(s))  POCT CBG (manual entry)     Status: None   Collection Time: 08/17/19  6:51 PM  Result Value Ref Range   POCT Glucose ([facility]) 96 70 - 99 mg/dL  POCT Influenza A/B     Status: None   Collection Time: 08/17/19  6:55 PM  Result Value Ref Range   Influenza A, POC Negative  Negative   Influenza B, POC Negative Negative     ASSESSMENT & PLAN:  1. Suspected COVID-19 virus infection   2. Flu-like symptoms     Flu negative [facility] ordered.  It will take between 2-5 days for test results.  Someone will contact you regarding abnormal results.    In the meantime: You should remain isolated in your home for 10 days from symptom onset AND greater than 72 hours after symptoms resolution (absence of fever without the use of fever-reducing medication and improvement in respiratory symptoms), whichever is longer Get plenty of rest and push fluids Alternate ibuprofen and tylenol as needed Call or go to the ED if you have any new or worsening symptoms such as fever, cough, shortness of breath, chest tightness, chest pain, turning blue, changes in mental status, etc...   Reviewed expectations re: course of current medical issues. Questions answered. Outlined signs and symptoms indicating need for more acute intervention. Patient verbalized understanding. Left prior to receiving after Visit Summary given.         Lestine Box, PA-C 08/17/19 1931

## 2019-08-17 NOTE — Discharge Instructions (Addendum)
Flu negative [facility] COVID [facility] ordered.  It will take between 2-5 days for test results.  Someone will contact you regarding abnormal results.    In the meantime: You should remain isolated in your home for 10 days from symptom onset AND greater than 72 hours after symptoms resolution (absence of fever without the use of fever-reducing medication and improvement in respiratory symptoms), whichever is longer Get plenty of rest and push fluids Alternate ibuprofen and tylenol as needed Call or go to the ED if you have any new or worsening symptoms such as fever, cough, shortness of breath, chest tightness, chest pain, turning blue, changes in mental status, etc..Marland Kitchen

## 2019-08-19 LAB — NOVEL CORONAVIRUS, NAA: SARS-CoV-2, NAA: NOT DETECTED

## 2019-08-30 ENCOUNTER — Encounter: Payer: Self-pay | Admitting: Internal Medicine

## 2019-09-13 ENCOUNTER — Encounter: Payer: Self-pay | Admitting: Internal Medicine

## 2019-09-13 ENCOUNTER — Ambulatory Visit: Payer: Self-pay | Admitting: Internal Medicine

## 2019-09-20 ENCOUNTER — Ambulatory Visit
Admission: RE | Admit: 2019-09-20 | Discharge: 2019-09-20 | Disposition: A | Discharge status: Home / Self Care | Payer: 59 | Source: Ambulatory Visit | Attending: Internal Medicine | Admitting: Internal Medicine

## 2019-09-20 DIAGNOSIS — E041 Nontoxic single thyroid nodule: Secondary | ICD-10-CM

## 2019-09-24 ENCOUNTER — Telehealth: Payer: Self-pay

## 2019-09-24 NOTE — Telephone Encounter (Signed)
Per SS call pt inform him his Ultrasound was normal Pt informed

## 2021-02-18 IMAGING — US US THYROID
1 series · 14 of 25 positions shown · non-contrast
Comparison: None.

CLINICAL DATA: Nodule on exam

EXAM:
THYROID ULTRASOUND
TECHNIQUE: Ultrasound examination of the thyroid gland and adjacent soft
tissues was performed.

[Series 1: us thyroid · 0.05mm/px · 14 of 37 slices shown]
[im 1/37]
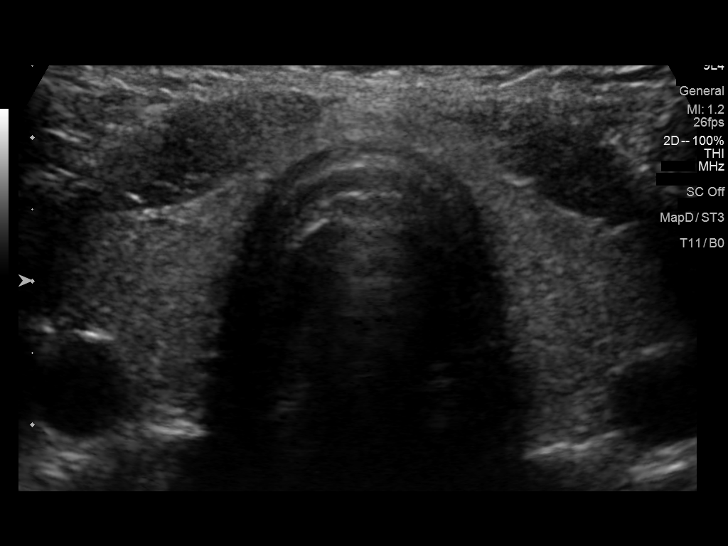
[im 4/37]
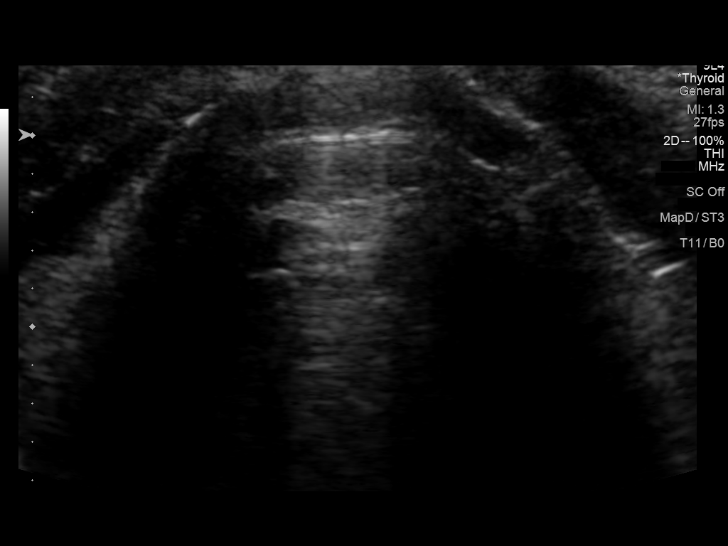
[im 7/37]
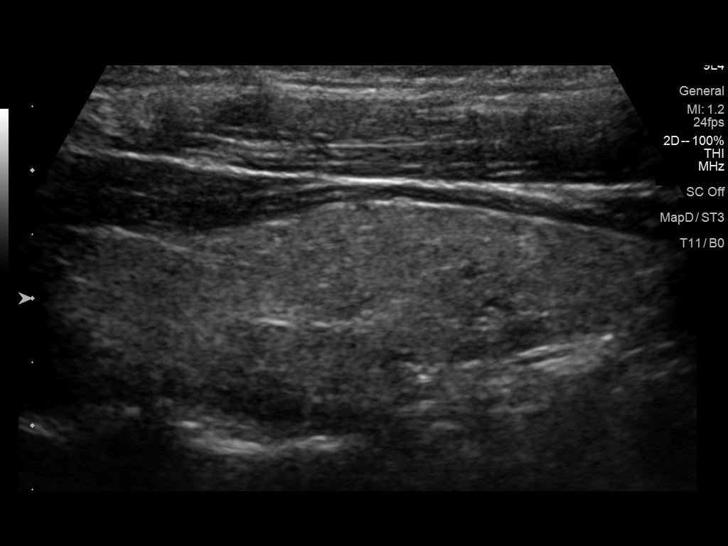
[im 10/37]
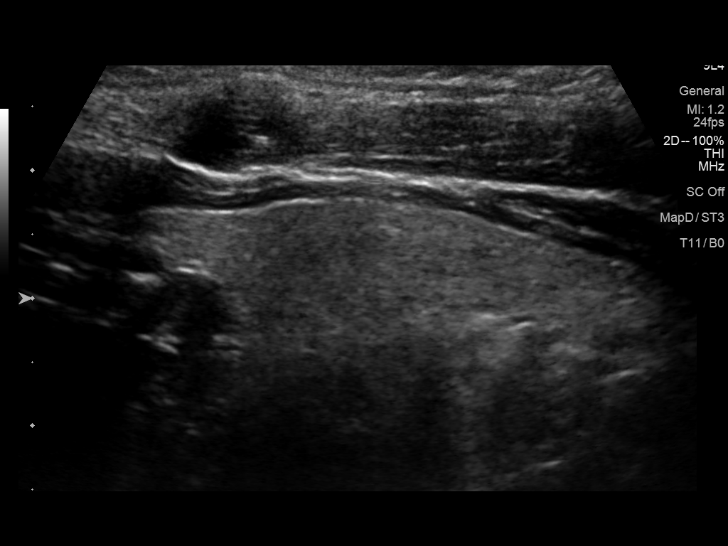
[im 13/37]
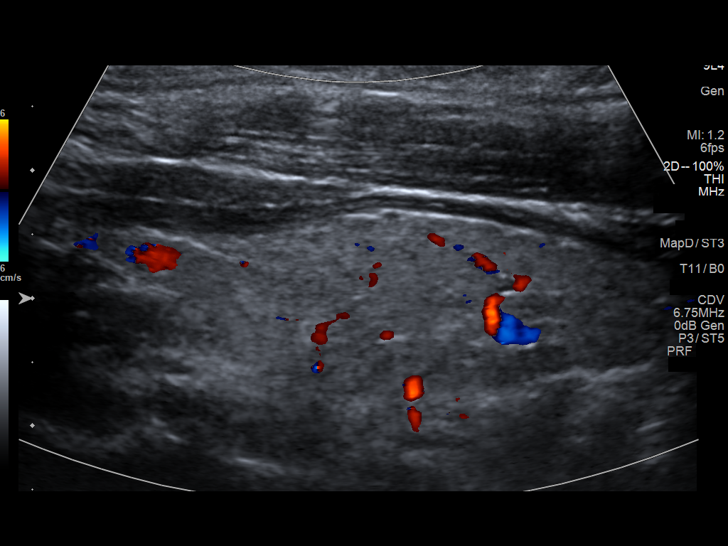
[im 14/37]
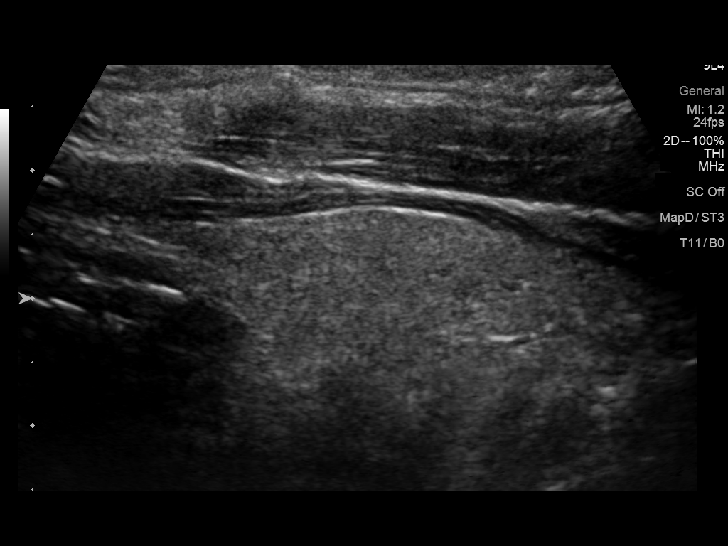
[im 17/37]
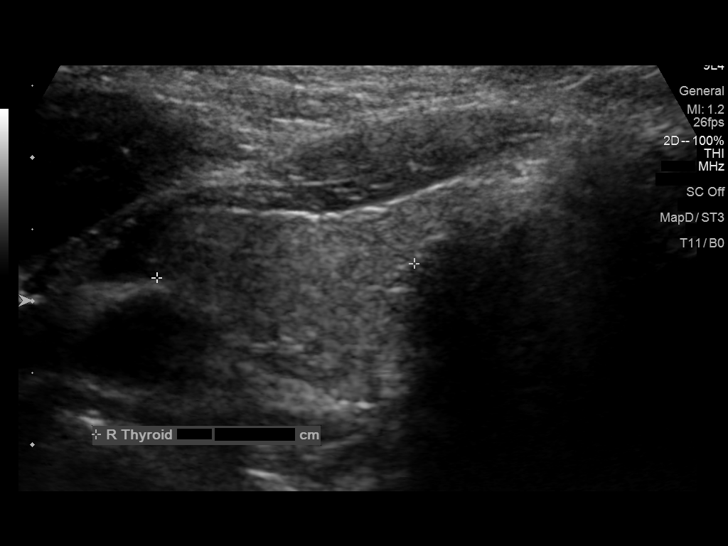
[im 20/37]
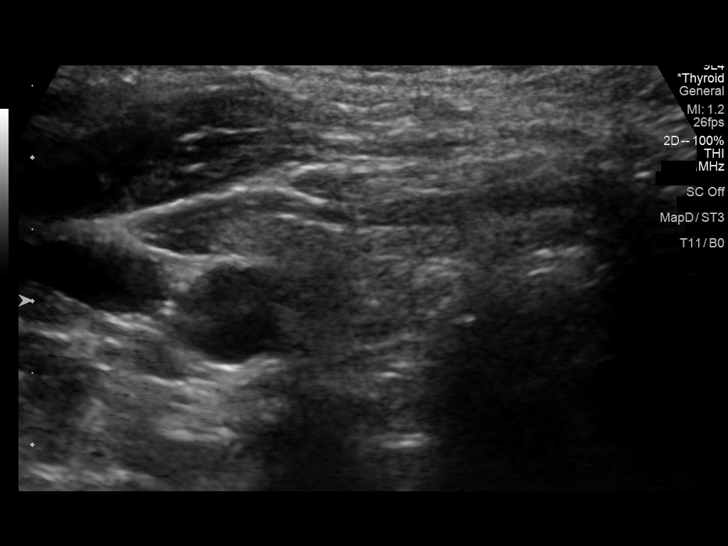
[im 23/37]
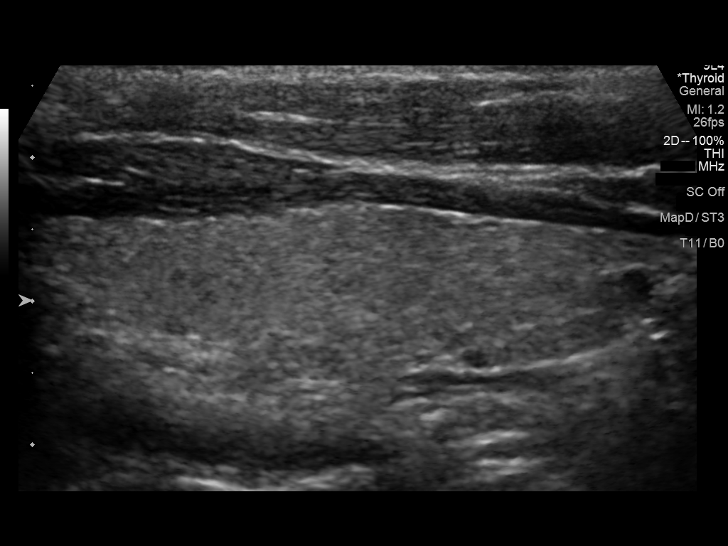
[im 25/37]
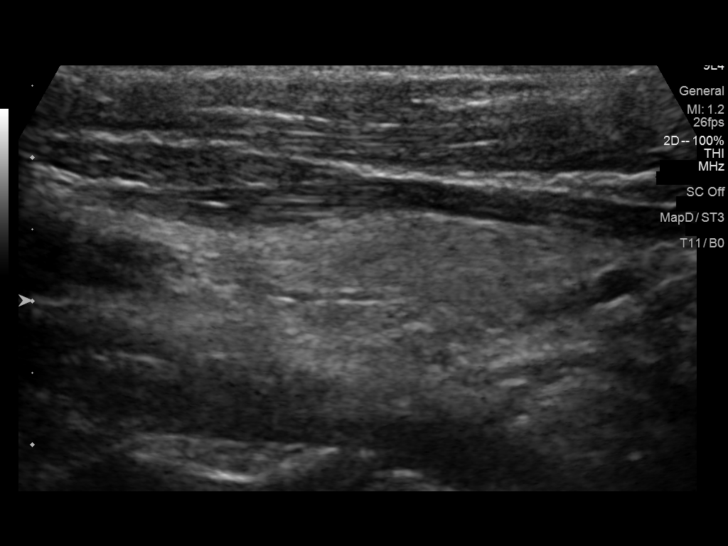
[im 28/37]
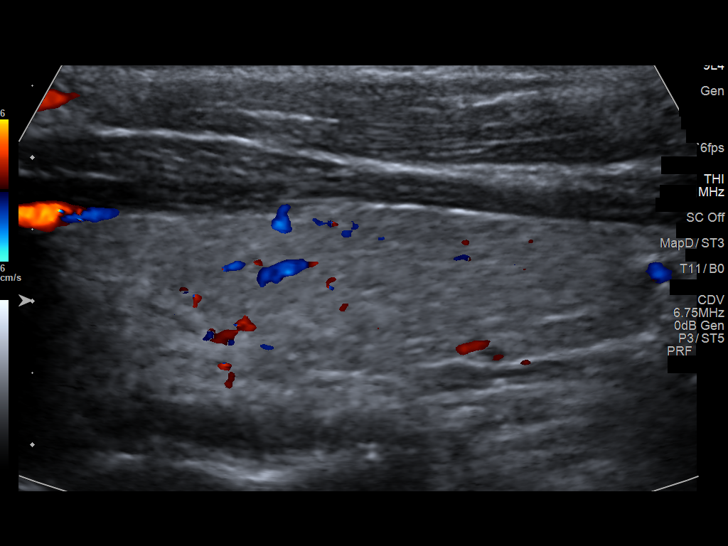
[im 31/37]
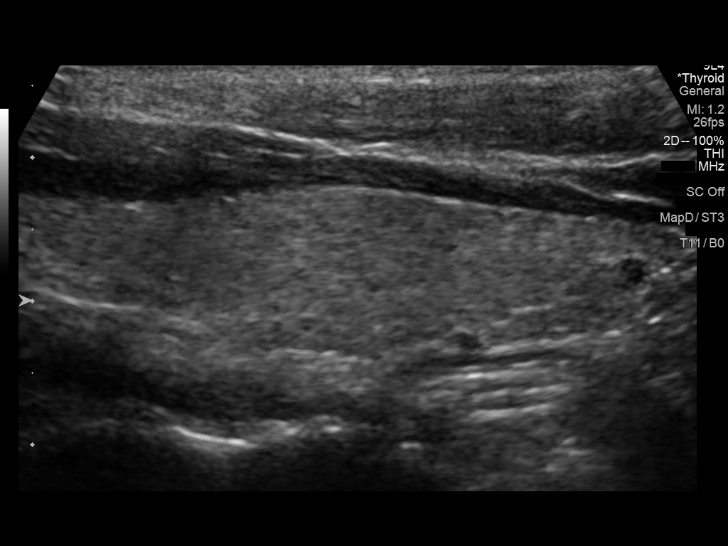
[im 34/37]
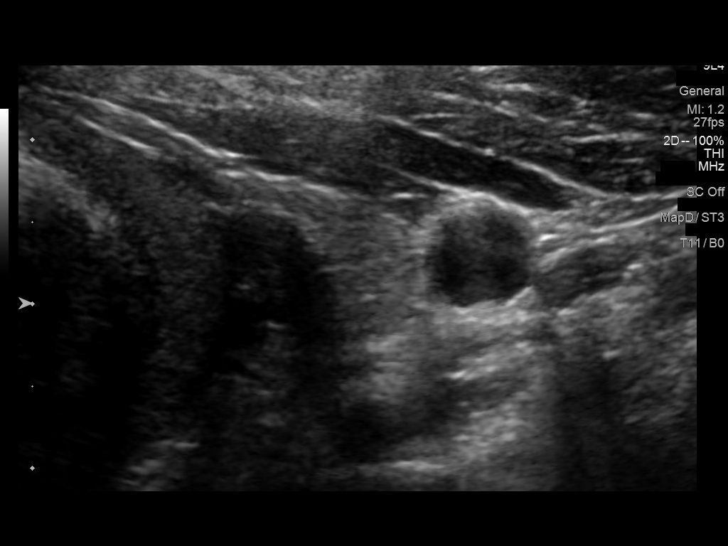
[im 37/37]
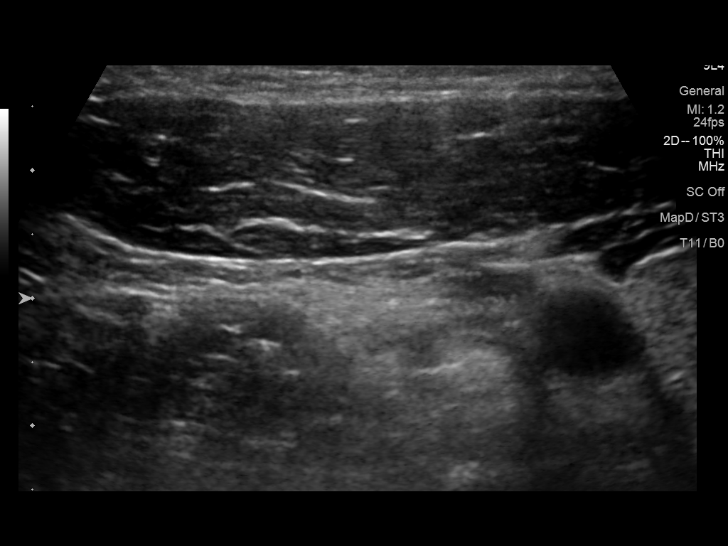

[14 of 25 positions shown; findings below may reference images not displayed]

FINDINGS: Parenchymal Echotexture: Normal

Isthmus: 3 mm

Right lobe: 4.9 x 1.6 x 1.8 cm

Left lobe: 4.7 x 1.3 x 1.6 cm

_________________________________________________________

Estimated total number of nodules >/= 1 cm: 0

Number of spongiform nodules >/=  2 cm not described below (TR1): 0

Number of mixed cystic and solid nodules >/= 1.5 cm not described
below (TR2): 0

_________________________________________________________

No discrete nodules are seen within the thyroid gland.
IMPRESSION: Normal thyroid ultrasound for age

The above is in keeping with the ACR TI-RADS recommendations - [HOSPITAL] 0832;[DATE].

## 2022-12-24 ENCOUNTER — Ambulatory Visit
Admission: RE | Admit: 2022-12-24 | Discharge: 2022-12-24 | Disposition: A | Discharge status: Home / Self Care | Payer: 59 | Source: Ambulatory Visit | Attending: Family Medicine | Admitting: Family Medicine

## 2022-12-24 VITALS — BP 122/83 | HR 64 | Temp 98.4°F | Resp 18

## 2022-12-24 DIAGNOSIS — H00014 Hordeolum externum left upper eyelid: Secondary | ICD-10-CM

## 2022-12-24 HISTORY — DX: Insulin resistance, unspecified: E88.819

## 2022-12-24 MED ORDER — POLYMYXIN B-TRIMETHOPRIM 10000-0.1 UNIT/ML-% OP SOLN: 1.0000 [drp] | OPHTHALMIC | 0 refills | Status: AC

## 2022-12-24 NOTE — Discharge Instructions (Signed)
Use the drops along with warm compresses off-and-on until the eye is improving.  Follow-up if it is worsening at any time.

## 2022-12-24 NOTE — ED Triage Notes (Signed)
Left eye pain since Wednesday.  Denies any vision changes.

## 2022-12-24 NOTE — ED Provider Notes (Signed)
[facility]    CSN: [id number] Arrival date & time: 12/24/22  0910      History   Chief Complaint Chief Complaint  Patient presents with   Eye Problem    Eye inflammation - Entered by patient    HPI Vincent Curtis is a 32 y.o. male.   Patient presenting today with 2-day history of left upper eyelid pain, swelling.  Denies any eye redness, drainage, vision changes, nausea, vomiting, injury to the eye.  So far not trying anything other than Tylenol with mild temporary benefit.    Past Medical History:  Diagnosis Date   Frequent loose stools 2016   even after avoiding dairy   Insulin resistance     Patient Active Problem List   Diagnosis Date Noted   Mixed hyperlipidemia 03/08/2019   Insulin resistance 08/30/2018   benig 2015 08/15/2013    History reviewed. No pertinent surgical history.     Home Medications    Prior to Admission medications   Medication Sig Start Date End Date Taking? Authorizing Provider  trimethoprim-polymyxin b (POLYTRIM) ophthalmic solution Place 1 drop into the left eye every 4 (four) hours. 12/24/22  Yes Particia Nearing, PA-C    Family History Family History  Problem Relation Age of Onset   Hypertension Mother    Hypertension Maternal Grandmother     Social History Social History   Tobacco Use   Smoking status: Never   Smokeless tobacco: Never  Substance Use Topics   Alcohol use: Not Currently   Drug use: Never     Allergies   Patient has no known allergies.   Review of Systems Review of Systems Per HPI  Physical Exam Triage Vital Signs ED Triage Vitals  Encounter Vitals Group     BP 12/24/22 0927 122/83     Systolic BP Percentile --      Diastolic BP Percentile --      Pulse Rate 12/24/22 0927 64     Resp 12/24/22 0927 18     Temp 12/24/22 0927 98.4 F (36.9 C)     Temp Source 12/24/22 0927 Oral     SpO2 12/24/22 0927 99 %     Weight --      Height --      Head Circumference --       Peak Flow --      Pain Score 12/24/22 0928 4     Pain Loc --      Pain Education --      Exclude from Growth Chart --    No data found.  Updated Vital Signs BP 122/83 (BP Location: Right Arm)   Pulse 64   Temp 98.4 F (36.9 C) (Oral)   Resp 18   SpO2 99%   Visual Acuity Right Eye Distance: 20/25 Left Eye Distance: 20/20 Bilateral Distance: 20/20  Right Eye Near:   Left Eye Near:    Bilateral Near:     Physical Exam Vitals and nursing note reviewed.  Constitutional:      Appearance: Normal appearance.  HENT:     Head: Atraumatic.  Eyes:     Extraocular Movements: Extraocular movements intact.     Conjunctiva/sclera: Conjunctivae normal.     Pupils: Pupils are equal, round, and reactive to light.     Comments: Small stye forming to the lash line of the left upper eyelid with surrounding edema, mild erythema.  Mildly tender to palpation in this area.  Conjunctiva benign bilaterally  Cardiovascular:     Rate and Rhythm: Normal rate and regular rhythm.  Pulmonary:     Effort: Pulmonary effort is normal.     Breath sounds: Normal breath sounds.  Musculoskeletal:        General: Normal range of motion.     Cervical back: Normal range of motion and neck supple.  Skin:    General: Skin is warm and dry.  Neurological:     General: No focal deficit present.     Mental Status: He is oriented to person, place, and time.  Psychiatric:        Mood and Affect: Mood normal.        Thought Content: Thought content normal.        Judgment: Judgment normal.      UC Treatments / Results  Labs (all labs ordered are listed, but only abnormal results are displayed) Labs Reviewed - No data to display  EKG   Radiology No results found.  Procedures Procedures (including critical care time)  Medications Ordered in UC Medications - No data to display  Initial Impression / Assessment and Plan / UC Course  I have reviewed the triage vital signs and the nursing  notes.  Pertinent labs & imaging results that were available during my care of the patient were reviewed by me and considered in my medical decision making (see chart for details).     Visual acuity intact, vitals and exam overall reassuring.  Will treat for stye with Polytrim drops, warm compresses and good hand hygiene.  Return for worsening symptoms.  Final Clinical Impressions(s) / UC Diagnoses   Final diagnoses:  Hordeolum externum of left upper eyelid     Discharge Instructions      Use the drops along with warm compresses off-and-on until the eye is improving.  Follow-up if it is worsening at any time.     ED Prescriptions     Medication Sig Dispense Auth. Provider   trimethoprim-polymyxin b (POLYTRIM) ophthalmic solution Place 1 drop into the left eye every 4 (four) hours. 10 mL Particia Nearing, [place]      PDMP not reviewed this encounter.   Roosvelt Maser Weir, [place] 12/24/22 [phone]

## 2023-05-16 ENCOUNTER — Ambulatory Visit (HOSPITAL_BASED_OUTPATIENT_CLINIC_OR_DEPARTMENT_OTHER): Payer: 59 | Admitting: Family Medicine

## 2023-06-13 ENCOUNTER — Ambulatory Visit (HOSPITAL_BASED_OUTPATIENT_CLINIC_OR_DEPARTMENT_OTHER): Payer: 59 | Admitting: Family Medicine

## 2024-08-28 ENCOUNTER — Ambulatory Visit: Admitting: Family Medicine
